# Patient Record
Sex: Male | Born: 2000 | Race: White | Hispanic: No | Marital: Single | Smoking: Never smoker
Health system: Southern US, Community
[De-identification: ages and names within clinical notes are randomized; demographics above are authoritative.]

## PROBLEM LIST (undated history)

## (undated) DIAGNOSIS — Z8739 Personal history of other diseases of the musculoskeletal system and connective tissue: Secondary | ICD-10-CM

## (undated) HISTORY — PX: TONSILLECTOMY: SUR1361

## (undated) HISTORY — DX: Personal history of other diseases of the musculoskeletal system and connective tissue: Z87.39

---
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
## 2024-01-31 ENCOUNTER — Ambulatory Visit: Payer: Self-pay | Admitting: Nurse Practitioner
